# Patient Record
Sex: Female | Born: 1965 | Race: White | Hispanic: No | Marital: Married | Smoking: Current every day smoker
Health system: Southern US, Community
[De-identification: ages and names within clinical notes are randomized; demographics above are authoritative.]

## PROBLEM LIST (undated history)

## (undated) DIAGNOSIS — F988 Other specified behavioral and emotional disorders with onset usually occurring in childhood and adolescence: Secondary | ICD-10-CM

## (undated) DIAGNOSIS — F419 Anxiety disorder, unspecified: Secondary | ICD-10-CM

## (undated) HISTORY — PX: NASAL SINUS SURGERY: SHX719

## (undated) HISTORY — PX: TONSILLECTOMY: SUR1361

## (undated) HISTORY — PX: DILATION AND CURETTAGE OF UTERUS: SHX78

---

## 1998-02-24 ENCOUNTER — Other Ambulatory Visit: Admission: RE | Admit: 1998-02-24 | Discharge: 1998-02-24 | Payer: Self-pay | Admitting: Obstetrics and Gynecology

## 1998-10-22 ENCOUNTER — Ambulatory Visit (HOSPITAL_COMMUNITY): Admission: RE | Admit: 1998-10-22 | Discharge: 1998-10-22 | Payer: Self-pay | Admitting: Obstetrics and Gynecology

## 1999-02-17 ENCOUNTER — Ambulatory Visit (HOSPITAL_COMMUNITY): Admission: RE | Admit: 1999-02-17 | Discharge: 1999-02-17 | Payer: Self-pay | Admitting: Gastroenterology

## 1999-03-08 ENCOUNTER — Other Ambulatory Visit: Admission: RE | Admit: 1999-03-08 | Discharge: 1999-03-08 | Payer: Self-pay | Admitting: Obstetrics and Gynecology

## 1999-07-19 ENCOUNTER — Encounter (INDEPENDENT_AMBULATORY_CARE_PROVIDER_SITE_OTHER): Payer: Self-pay | Admitting: Specialist

## 1999-07-19 ENCOUNTER — Other Ambulatory Visit: Admission: RE | Admit: 1999-07-19 | Discharge: 1999-07-19 | Payer: Self-pay | Admitting: *Deleted

## 1999-08-12 ENCOUNTER — Emergency Department (HOSPITAL_COMMUNITY): Admission: EM | Admit: 1999-08-12 | Discharge: 1999-08-12 | Payer: Self-pay | Admitting: Emergency Medicine

## 2000-03-07 ENCOUNTER — Other Ambulatory Visit: Admission: RE | Admit: 2000-03-07 | Discharge: 2000-03-07 | Payer: Self-pay | Admitting: Obstetrics and Gynecology

## 2000-05-23 ENCOUNTER — Encounter: Payer: Self-pay | Admitting: Internal Medicine

## 2000-05-23 ENCOUNTER — Inpatient Hospital Stay (HOSPITAL_COMMUNITY): Admission: AD | Admit: 2000-05-23 | Discharge: 2000-05-25 | Payer: Self-pay | Admitting: Internal Medicine

## 2001-03-26 ENCOUNTER — Other Ambulatory Visit: Admission: RE | Admit: 2001-03-26 | Discharge: 2001-03-26 | Payer: Self-pay | Admitting: Obstetrics and Gynecology

## 2002-04-08 ENCOUNTER — Other Ambulatory Visit: Admission: RE | Admit: 2002-04-08 | Discharge: 2002-04-08 | Payer: Self-pay | Admitting: Obstetrics and Gynecology

## 2003-04-02 ENCOUNTER — Other Ambulatory Visit: Admission: RE | Admit: 2003-04-02 | Discharge: 2003-04-02 | Payer: Self-pay | Admitting: Obstetrics and Gynecology

## 2004-01-12 ENCOUNTER — Ambulatory Visit (HOSPITAL_COMMUNITY): Admission: RE | Admit: 2004-01-12 | Discharge: 2004-01-12 | Payer: Self-pay | Admitting: Gastroenterology

## 2004-06-10 ENCOUNTER — Other Ambulatory Visit: Admission: RE | Admit: 2004-06-10 | Discharge: 2004-06-10 | Payer: Self-pay | Admitting: Obstetrics and Gynecology

## 2004-12-04 ENCOUNTER — Emergency Department (HOSPITAL_COMMUNITY): Admission: EM | Admit: 2004-12-04 | Discharge: 2004-12-04 | Payer: Self-pay | Admitting: Emergency Medicine

## 2005-03-30 ENCOUNTER — Emergency Department (HOSPITAL_COMMUNITY): Admission: EM | Admit: 2005-03-30 | Discharge: 2005-03-30 | Payer: Self-pay | Admitting: Emergency Medicine

## 2005-06-23 ENCOUNTER — Other Ambulatory Visit: Admission: RE | Admit: 2005-06-23 | Discharge: 2005-06-23 | Payer: Self-pay | Admitting: Obstetrics and Gynecology

## 2007-06-18 ENCOUNTER — Ambulatory Visit (HOSPITAL_COMMUNITY): Admission: RE | Admit: 2007-06-18 | Discharge: 2007-06-18 | Payer: Self-pay | Admitting: Obstetrics and Gynecology

## 2007-06-18 ENCOUNTER — Encounter (INDEPENDENT_AMBULATORY_CARE_PROVIDER_SITE_OTHER): Payer: Self-pay | Admitting: Obstetrics and Gynecology

## 2008-04-02 ENCOUNTER — Emergency Department (HOSPITAL_COMMUNITY): Admission: EM | Admit: 2008-04-02 | Discharge: 2008-04-02 | Payer: Self-pay | Admitting: Emergency Medicine

## 2009-06-28 ENCOUNTER — Emergency Department (HOSPITAL_COMMUNITY): Admission: EM | Admit: 2009-06-28 | Discharge: 2009-06-28 | Payer: Self-pay | Admitting: Emergency Medicine

## 2010-10-10 ENCOUNTER — Encounter: Payer: Self-pay | Admitting: Obstetrics and Gynecology

## 2011-02-01 NOTE — Op Note (Signed)
Cindy Stone, Cindy Stone             ACCOUNT NO.:  [id number]   MEDICAL RECORD NO.:  [id number]          PATIENT TYPE:  AMB   LOCATION:  SDC                           FACILITY:  [facility]   PHYSICIAN:  Juluis Mire, M.D.   DATE OF BIRTH:  [birth year]   DATE OF PROCEDURE:  06/18/2007  DATE OF DISCHARGE:  06/17/2007                               OPERATIVE REPORT   PREOPERATIVE DIAGNOSIS:  Endometrial polyp.   POSTOPERATIVE DIAGNOSIS:  Endometrial polyp.   PROCEDURE:  Hysteroscopy, resection of polyp and endometrial curettings.   SURGEON:  Juluis Mire, M.D.   ANESTHESIA:  General.   ESTIMATED BLOOD LOSS:  Minimal.   PACKS AND DRAINS:  None.   INTRAOPERATIVE BLOOD REPLACED:  Placed none.   COMPLICATIONS:  None.   INDICATIONS:  Noted in history and physical.   PROCEDURE:  Patient was taken to OR, placed supine position.  After  satisfactory level of general anesthesia obtained, the patient was  placed in dorsal lithotomy position using Allen stirrups.  Perineum,  vagina cleansed out with antiseptic solution and draped in sterile  field, speculum was placed in the vaginal vault.  Cervix was grasped  with single tooth tenacula.  Uterus sounded to approximately 10 cm.  Cervix serially dilated to a size 35 Pratt dilator.  Operative  hysteroscope was introduced.  Intrauterine cavity was distended using  sorbitol.  Visualization revealed a large endometrial polyp on the  posterior wall.  This was resected and sent for pathological review.  Endometrial curettings were then obtained.  Total deficit was 100 mL.  There were no signs of complication, no active bleeding or signs of  perforation.  At this point in time, the patient taken out of dorsal  lithotomy position.  Once alert and extubated, transferred to recovery  in good condition.  Sponge, instrument and needle count reported as  correct by circulating nurse.      Juluis Mire, M.D.  Electronically Signed     JSM/MEDQ   D:  06/18/2007  T:  06/18/2007  Job:  [id number]

## 2011-02-01 NOTE — H&P (Signed)
NAME:  Cindy Stone, Cindy Stone NO.:  [id number]   MEDICAL RECORD NO.:  [id number]          PATIENT TYPE:  AMB   LOCATION:                                FACILITY:  [facility]   PHYSICIAN:  Juluis Mire, M.D.   DATE OF BIRTH:  November 04, 1965   DATE OF ADMISSION:  DATE OF DISCHARGE:                              HISTORY & PHYSICAL   The patient is a 45 year old gravida 3, para 3, married female, presents  for hysteroscopy.   In relation to the present admission, the patient had endometrial cells  noted on her Pap smear.  Subsequent saline infusion ultrasound revealed  large endometrial polyp for which she now presents for resection.   ALLERGIES:  No known drug allergies.   MEDICATIONS:  Paxil and Ambien.   PAST MEDICAL HISTORY:  Usual childhood diseases.  No significant  sequelae.  Does have a history of asthma as a child and associated  bronchitis.   PAST SURGICAL HISTORY:  She has had previous nose surgery.  She has also  had surgery on her bladder.   OBSTETRICAL HISTORY:  She has had 3 vaginal deliveries.   FAMILY HISTORY:  Noncontributory.   SOCIAL HISTORY:  No tobacco or alcohol use.   REVIEW OF SYSTEMS:  Noncontributory.   PHYSICAL EXAMINATION:  The patient is afebrile with stable vital signs.  HEENT:  The patient is normocephalic.  Pupils equally round, react to  light and accommodation.  Extraocular movements were intact.  Sclerae  and conjunctivae are clear.  Oropharynx clear.  NECK:  Without thyromegaly.  BREASTS:  No discrete masses.  LUNGS:  Clear.  CARDIOVASCULAR SYSTEM:  Regular rate.  No murmurs or gallops.  ABDOMINAL EXAM:  Benign.  PELVIC:  Normal external genitalia.  Vaginal mucosa is clear.  Cervix  unremarkable.  Uterus normal size, shape and contour.  Adnexa free of  masses or tenderness.  EXTREMITIES:  Trace edema.  NEUROLOGICAL EXAM:  Grossly within normal limits.   IMPRESSION:  Endometrial polyp.   PLAN:  The patient is to undergo  hysteroscopic evaluation with  resection.  The risks  have been explained.  The risks include the risk  of infection.  Risk of vascular injury that could lead to hemorrhage  requiring transfusion or  possible hysterectomy.  Risk of injury to adjacent organs including  bladder, bowel, ureters that could require further exploratory surgery.  Risk of deep venous thrombosis and pulmonary emboli.  The patient  expressed understanding of indications and risks.      Juluis Mire, M.D.  Electronically Signed     JSM/MEDQ  D:  06/18/2007  T:  06/18/2007  Job:  [id number]

## 2011-02-04 NOTE — Op Note (Signed)
NAME:  Cindy Stone, NAPLES                       ACCOUNT NO.:  [id number]   MEDICAL RECORD NO.:  [id number]                   PATIENT TYPE:  AMB   LOCATION:  ENDO                                 FACILITY:  [facility]   PHYSICIAN:  James L. Malon Kindle., M.D.          DATE OF BIRTH:  03-26-1966   DATE OF PROCEDURE:  01/12/2004  DATE OF DISCHARGE:                                 OPERATIVE REPORT   PROCEDURE:  Colonoscopy.   MEDICATIONS:  Fentanyl 100 mcg, Versed 9 mg IV.   SCOPE:  Pediatric colonoscope.   INDICATIONS:  Strong family history of colon cancer.  Father had colon  cancer as well as a great-grandparent, some grandparents, uncles and aunts.  Mother has had polyps.  Brothers have had polyps.   DESCRIPTION OF PROCEDURE:  The procedure had been explained to the patient  and consent obtained.   With the patient in the left lateral decubitus position, the Olympus scope  was inserted and advanced.  We were able to reach the cecum without  difficulty.  The ileocecal valve and appendiceal orifice were seen.  The  scope was withdrawn and the cecum, ascending colon, transverse colon,  splenic flexure, descending and sigmoid colon were seen well.  No polyps  were seen throughout.  No significant diverticular disease.  The scope was  withdrawn to the rectum.  The rectum was free of polyps.  The scope was  withdrawn. The patient tolerated the procedure well.   ASSESSMENT:  No evidence of further colon polyps in a young woman with a  strong family history of colon cancer.  V16.0.   PLAN:  Will recommend repeating colonoscopy in five years and yearly  hemoccults.                                               James L. Malon Kindle., M.D.    Waldron Session  D:  01/12/2004  T:  01/12/2004  Job:  [id number]

## 2011-02-04 NOTE — Discharge Summary (Signed)
. [facility]  Patient:    Cindy Stone, Cindy Stone                 MRN: [id number] Adm. Date:  [id number] Disc. Date: [id number] Attending:  Miguel Aschoff CC:         Richard A. Jacky Kindle, M.D. fax # is [phone]   Discharge Summary  PROBLEM LIST: 1. Left upper lobe pneumonia, presumed viral, with associated atypical    lymphocytes. 2. Normocytic anemia, as well as leukopenia, lymphocyte predominant    differential. 3. Tobacco use, greater than 18 pack years. 4. History of pneumococcal pneumonia in May 1993. 5. History of sinus surgery in October 2000. 6. History of childhood asthma. 7. Seasonal allergies, receiving weekly desensitization shots and Claritin    p.r.n. 8. Insomnia.  REASON FOR ADMISSION:  The patient is a 45 year old female who was admitted by Urgent Care on May 23, 2000, for a left-sided pneumonia.  She had approximately six days of shortness of breath and a nonproductive cough, fevers of 101.5 at home, associated with shaking chills.  Because of her symptoms and progressive shortness of breath she was seen in Urgent Care on three consecutive days, September 2, 3, and 4, 2001, at which time she was given a dose of Rocephin IM, as well as three days of oral Tequin.  Chest x-rays taken on September 2 and 3, 2001, revealed a progressive left-sided pneumonia.  Blood work prior to admission revealed a normal white cell count of 4.5, hematocrit of 36.2, platelets of 163, 36% lymphocytes, and 55% granulocytes.  HOSPITAL COURSE:  The patient was admitted to a regular medical floor.  Her antibiotics were changed to azithromycin and ceftriaxone which she received for one day.  Given evidence supporting a viral etiology, antibiotics were discontinued on the second day.  The patient gradually improved with respect to her dyspnea, cough, and general malaise.  She did continue to have low-grade fevers up to 100 the day prior to  discharge.  She also received other supportive therapies, such as albuterol inhaler, antitussive medication, anti-inflammatories, and Tylenol.  ISSUES: #1 - LEFT UPPER LOBE PNEUMONIA PRESUMED VIRAL:  Chest x-ray obtained on admission was consistent with a left upper lobe infiltrate.  As noted above, the patients symptoms improved gradually with supportive care and discontinued antibiotics.  Blood cultures were drawn on admission which were negative, however, this was after three days of antibiotics.  The patient will follow up with her primary care Edelin Fryer in several days time to review her symptoms as well as review additional blood work as necessary.  She would probably need a followup chest x-ray in one months time to give that a quit time for resolution of changes.  #2 - ANEMIA AND LEUKOPENIA:  The patient did have a low normal white count, actually dipping to 3.9 with 30% neutrophils, lymphocyte predominant picture with atypical lymphocytes toxic granules.  Her discharge white blood count was 4.5, consistent with her numbers prior to admission.  In addition, her hematocrit had a significant drop from the mid 30s prior to admission to 28 on discharge.  Due to her falling counts, hematology consult was requested.  They reviewed the smear and noted multiple polysegmented neutrophils, and recommend testing for folate and B12 deficiencies, as well as a ferratin level.  LDH in the hospital was normal at 230, and reticulocyte count is pending.  It is assumed that her anemia and relative leukopenia was related to an acute viral infection, and  the hematology felt her safe for discharge with close followup.  #3 - TOBACCO USE:  Given history of prior pneumococcal pneumonia and current viral pneumonia, as well as all other associated risks, the patient was strongly advised to stop smoking.  #4 - ANXIETY:  The patient was significantly concerned about her health with her current symptoms,  and received extensive counseling for reassurance and to answer questions.  DISCHARGE LABORATORY DATA:  On May 25, 2000, white count of 4.5, hematocrit of 28.2, platelets of 191, MCV of 90.9, neutrophils 30%, lymphocytes 45%, monocytes 9%, eosinophils 9%.  PROCEDURES: 1. Chest x-ray. 2. Hematology consult.  DISPOSITION:  The patient will be discharged to home.  DISCHARGE MEDICATIONS: 1. Tussionex 5 to 10 cc p.o. q.h.s. 2. Tylenol p.r.n. 3. Sonata 5 to 10 mg p.o. q.h.s. 4. Albuterol metered dose inhaler one or two puffs q.2h. p.r.n.  The patient was advised to drink 1.5 to 2 L of fluid each day.  She was endorsing a little bit of lightheadedness upon standing, but was not orthostatic.  She also was advised prior to discharge to avoid taking NSAIDS as these can be associated with acquired pancytopenia.  She will be assessed by her primary care doctor, Dr. Jacky Kindle on Monday, May 29, 2000, at which time it would be recommended to check another CBC with differential, as well as a ferratin, B12, and folate shot. DD:  05/25/00 TD:  05/26/00 Job: [id number]

## 2011-06-30 LAB — CBC
HCT: 35.2 — ABNORMAL LOW
Hemoglobin: 12.1
MCHC: 34.3
MCV: 98.9
Platelets: 230
RBC: 3.55 — ABNORMAL LOW
RDW: 12.3
WBC: 6.4

## 2011-06-30 LAB — HCG, SERUM, QUALITATIVE: Preg, Serum: NEGATIVE

## 2012-10-15 ENCOUNTER — Other Ambulatory Visit: Payer: Self-pay | Admitting: Obstetrics and Gynecology

## 2012-10-15 DIAGNOSIS — R928 Other abnormal and inconclusive findings on diagnostic imaging of breast: Secondary | ICD-10-CM

## 2012-10-17 ENCOUNTER — Other Ambulatory Visit: Payer: Self-pay

## 2012-10-18 ENCOUNTER — Ambulatory Visit
Admission: RE | Admit: 2012-10-18 | Discharge: 2012-10-18 | Disposition: A | Discharge status: Home / Self Care | Payer: No Typology Code available for payment source | Source: Ambulatory Visit | Attending: Obstetrics and Gynecology | Admitting: Obstetrics and Gynecology

## 2012-10-18 ENCOUNTER — Other Ambulatory Visit: Payer: Self-pay

## 2012-10-18 DIAGNOSIS — R928 Other abnormal and inconclusive findings on diagnostic imaging of breast: Secondary | ICD-10-CM

## 2012-10-26 ENCOUNTER — Other Ambulatory Visit: Payer: Self-pay

## 2013-12-26 ENCOUNTER — Emergency Department (HOSPITAL_COMMUNITY)
Admission: EM | Admit: 2013-12-26 | Discharge: 2013-12-26 | Disposition: A | Discharge status: Home / Self Care | Payer: No Typology Code available for payment source | Attending: Emergency Medicine | Admitting: Emergency Medicine

## 2013-12-26 ENCOUNTER — Encounter (HOSPITAL_COMMUNITY): Payer: Self-pay | Admitting: Emergency Medicine

## 2013-12-26 DIAGNOSIS — Z79899 Other long term (current) drug therapy: Secondary | ICD-10-CM | POA: Insufficient documentation

## 2013-12-26 DIAGNOSIS — Z3202 Encounter for pregnancy test, result negative: Secondary | ICD-10-CM | POA: Insufficient documentation

## 2013-12-26 DIAGNOSIS — R519 Headache, unspecified: Secondary | ICD-10-CM

## 2013-12-26 DIAGNOSIS — R110 Nausea: Secondary | ICD-10-CM | POA: Insufficient documentation

## 2013-12-26 DIAGNOSIS — R209 Unspecified disturbances of skin sensation: Secondary | ICD-10-CM | POA: Insufficient documentation

## 2013-12-26 DIAGNOSIS — F17200 Nicotine dependence, unspecified, uncomplicated: Secondary | ICD-10-CM | POA: Insufficient documentation

## 2013-12-26 DIAGNOSIS — R42 Dizziness and giddiness: Secondary | ICD-10-CM | POA: Insufficient documentation

## 2013-12-26 DIAGNOSIS — R51 Headache: Secondary | ICD-10-CM | POA: Insufficient documentation

## 2013-12-26 DIAGNOSIS — H53149 Visual discomfort, unspecified: Secondary | ICD-10-CM | POA: Insufficient documentation

## 2013-12-26 DIAGNOSIS — R55 Syncope and collapse: Secondary | ICD-10-CM | POA: Insufficient documentation

## 2013-12-26 LAB — URINALYSIS, ROUTINE W REFLEX MICROSCOPIC
BILIRUBIN URINE: NEGATIVE
Glucose, UA: NEGATIVE mg/dL
Ketones, ur: NEGATIVE mg/dL
Leukocytes, UA: NEGATIVE
Nitrite: NEGATIVE
Protein, ur: NEGATIVE mg/dL
SPECIFIC GRAVITY, URINE: 1.012 (ref 1.005–1.030)
Urobilinogen, UA: 0.2 mg/dL (ref 0.0–1.0)
pH: 6 (ref 5.0–8.0)

## 2013-12-26 LAB — CBC
HEMATOCRIT: 36.9 % (ref 36.0–46.0)
Hemoglobin: 12.7 g/dL (ref 12.0–15.0)
MCH: 32.7 pg (ref 26.0–34.0)
MCHC: 34.4 g/dL (ref 30.0–36.0)
MCV: 95.1 fL (ref 78.0–100.0)
Platelets: 252 10*3/uL (ref 150–400)
RBC: 3.88 MIL/uL (ref 3.87–5.11)
RDW: 11.9 % (ref 11.5–15.5)
WBC: 8.1 10*3/uL (ref 4.0–10.5)

## 2013-12-26 LAB — POC URINE PREG, ED: PREG TEST UR: NEGATIVE

## 2013-12-26 LAB — BASIC METABOLIC PANEL
BUN: 14 mg/dL (ref 6–23)
CHLORIDE: 103 meq/L (ref 96–112)
CO2: 21 mEq/L (ref 19–32)
CREATININE: 0.56 mg/dL (ref 0.50–1.10)
Calcium: 9.6 mg/dL (ref 8.4–10.5)
GFR calc Af Amer: >90 mL/min (ref >90)
GFR calc non Af Amer: >90 mL/min (ref >90)
Glucose, Bld: 102 mg/dL — ABNORMAL HIGH (ref 70–99)
Potassium: 4.2 mEq/L (ref 3.7–5.3)
Sodium: 139 mEq/L (ref 137–147)

## 2013-12-26 LAB — URINE MICROSCOPIC-ADD ON

## 2013-12-26 MED ORDER — SODIUM CHLORIDE 0.9 % IV BOLUS (SEPSIS)
1000.0000 mL | Freq: Once | INTRAVENOUS | Status: AC
  Administered 2013-12-26: 1000 mL via INTRAVENOUS

## 2013-12-26 MED ORDER — DIPHENHYDRAMINE HCL 50 MG/ML IJ SOLN
25.0000 mg | Freq: Once | INTRAMUSCULAR | Status: AC
  Administered 2013-12-26: 25 mg via INTRAVENOUS
  Filled 2013-12-26: qty 1

## 2013-12-26 MED ORDER — ONDANSETRON HCL 4 MG/2ML IJ SOLN
4.0000 mg | Freq: Once | INTRAMUSCULAR | Status: AC
  Administered 2013-12-26: 4 mg via INTRAVENOUS
  Filled 2013-12-26: qty 2

## 2013-12-26 MED ORDER — KETOROLAC TROMETHAMINE 30 MG/ML IJ SOLN
30.0000 mg | Freq: Once | INTRAMUSCULAR | Status: AC
  Administered 2013-12-26: 30 mg via INTRAVENOUS
  Filled 2013-12-26: qty 1

## 2013-12-26 MED ORDER — METOCLOPRAMIDE HCL 5 MG/ML IJ SOLN
10.0000 mg | Freq: Once | INTRAMUSCULAR | Status: AC
  Administered 2013-12-26: 10 mg via INTRAVENOUS
  Filled 2013-12-26: qty 2

## 2013-12-26 NOTE — Discharge Instructions (Signed)
Call for a follow up appointment with a Family or Primary Care Provider.  Return to the Emergency Department if Symptoms worsen.   Take medication as prescribed.  Eat a well balanced diet and drink plenty of fluids.

## 2013-12-26 NOTE — ED Notes (Signed)
Pt c/o tingling, near syncopal episode, and headache stating this afternoon.  Pain score 7/10.  Pt reports that tingling and feeling of near syncope has resolved.  Pt reports she was seen by her ob/gyn because she was worried about anemia, but her Hgb was 13.5.

## 2013-12-26 NOTE — ED Notes (Signed)
Patient sitting up A&O x4. Informed of care plan. Ordered meds given, see MAR. Patient able to move all extremities and states "pain is improving." Resting comfortably at this time.

## 2013-12-26 NOTE — ED Provider Notes (Signed)
CSN: [id number]     Arrival date & time 12/26/13  1437 History   First MD Initiated Contact with Patient 12/26/13 1516     Chief Complaint  Patient presents with  . Near Syncope  . Headache     (Consider location/radiation/quality/duration/timing/severity/associated sxs/prior Treatment) HPI Comments: Cindy Stone is a 48 y.o. female in good health until today when she experienced an episode of near syncope, while at work.  She reports sitting at her desk, and while talking developed lightheadedness and numbness and tingling in fingers, self resolved.  She reports this lasted a short period of time and self resolved.  She denies LOC and reports the episode was witnessed  The patient was taken to her OB/GYN for further evaluation and Hbg check, due to heavy menses.  She states while waiting for the provider she developed a headache.  She describes the discomfort as gradual onset of headache, vertex dull constant pain. Denies aggravating factors. With associated mild photophobia.  The patient reports headaches in the past, similar but without lightheadedness. She reports nausea without emesis, and relates it to not eating anything today.  The patient reports her OB/GYN instructed her to come to the ED for further evaluation after checking her HBG, 13.5.  Patient's last menstrual period was 12/21/2013.   Patient is a 48 y.o. female presenting with near-syncope and headaches.  Near Syncope Associated symptoms include headaches and nausea. Pertinent negatives include no abdominal pain, chest pain, chills, diaphoresis, fever, neck pain or vomiting.  Headache Associated symptoms: nausea, near-syncope and photophobia   Associated symptoms: no abdominal pain, no diarrhea, no dizziness, no fever, no neck pain, no neck stiffness and no vomiting     History reviewed. No pertinent past medical history. Past Surgical History  Procedure Laterality Date  . Tonsillectomy    . Nasal sinus surgery    .  Dilation and curettage of uterus     History reviewed. No pertinent family history. History  Substance Use Topics  . Smoking status: Current Every Day Smoker -- 0.50 packs/day    Types: Cigarettes  . Smokeless tobacco: Not on file  . Alcohol Use: Yes   OB History   Grav Para Term Preterm Abortions TAB SAB Ect Mult Living                 Review of Systems  Constitutional: Negative for fever, chills and diaphoresis.  Eyes: Positive for photophobia. Negative for visual disturbance.  Respiratory: Negative for shortness of breath.   Cardiovascular: Positive for near-syncope. Negative for chest pain, palpitations and leg swelling.  Gastrointestinal: Positive for nausea. Negative for vomiting, abdominal pain, diarrhea and constipation.  Musculoskeletal: Negative for gait problem, neck pain and neck stiffness.  Neurological: Positive for light-headedness and headaches. Negative for dizziness and syncope.      Allergies  Fish allergy and Shellfish allergy  Home Medications   Current Outpatient Rx  Name  Route  Sig  Dispense  Refill  . cetirizine (ZYRTEC) 10 MG tablet   Oral   Take 10 mg by mouth daily.         Marland Kitchen. PARoxetine (PAXIL) 10 MG tablet   Oral   Take 10 mg by mouth daily.         Marland Kitchen. zolpidem (AMBIEN) 10 MG tablet   Oral   Take 10 mg by mouth at bedtime.          BP 131/70  Pulse 71  Temp(Src) 99.4 F (37.4 C) (Oral)  Resp 21  SpO2 96%  LMP 12/21/2013 Physical Exam  Nursing note and vitals reviewed. Constitutional: She is oriented to person, place, and time. She appears well-developed and well-nourished. No distress.  HENT:  Head: Normocephalic and atraumatic.  Eyes: Conjunctivae and EOM are normal. Pupils are equal, round, and reactive to light. No scleral icterus.  Neck: Normal range of motion. Neck supple.  Cardiovascular: Normal rate and regular rhythm.   Pulmonary/Chest: Effort normal and breath sounds normal. No respiratory distress. She has no  wheezes. She has no rales.  Abdominal: Soft. There is no tenderness. There is no rebound and no guarding.  Musculoskeletal: Normal range of motion.  Neurological: She is alert and oriented to person, place, and time.  Speech is clear and goal oriented, follows commands Cranial nerves III - XII grossly intact, no facial droop Normal strength in upper and lower extremities bilaterally, strong and equal grip strength Sensation normal to light and sharp touch Moves all 4 extremities without ataxia, coordination intact Ambulates without assistance, normal gait.  Skin: Skin is warm and dry. She is not diaphoretic.  Psychiatric: She has a normal mood and affect. Her behavior is normal.    ED Course  Procedures (including critical care time) Labs Review Labs Reviewed  BASIC METABOLIC PANEL - Abnormal; Notable for the following:    Glucose, Bld 102 (*)    All other components within normal limits  URINALYSIS, ROUTINE W REFLEX MICROSCOPIC - Abnormal; Notable for the following:    APPearance CLOUDY (*)    Hgb urine dipstick LARGE (*)    All other components within normal limits  URINE MICROSCOPIC-ADD ON - Abnormal; Notable for the following:    Bacteria, UA FEW (*)    All other components within normal limits  CBC  POC URINE PREG, ED   Imaging Review No results found.   EKG Interpretation   Date/Time:  Thursday December 26 2013 15:53:02 EDT Ventricular Rate:  74 PR Interval:  136 QRS Duration: 84 QT Interval:  493 QTC Calculation: 547 R Axis:   53 Text Interpretation:  Sinus rhythm Borderline T abnormalities, anterior  leads Prolonged QT interval Confirmed by WARD,  DO, KRISTEN ([id number]) on  12/26/2013 4:07:24 PM      MDM   Final diagnoses:  Near syncope  Headache   Pt with a near syncopal event and headache.  No neuro deficits on exam. Doubt SAH at this time. Migraine cocktail, labs ordered. CBC and BMP Without concerning abnormalities. Negative urine pregnancy. UA shows  blood, secondary to menses. EKG shows borderline T wave abnormalities with prolonged QT interval.  Re-eval pt reports symptom improvement and requesting to be discharged home. Discussed lab results, imaging results, and treatment plan with the patient. Return precautions given. Reports understanding and no other concerns at this time.  Patient is stable for discharge at this time.  Meds given in ED:  Medications  sodium chloride 0.9 % bolus 1,000 mL (0 mLs Intravenous Stopped 12/26/13 1645)  ketorolac (TORADOL) 30 MG/ML injection 30 mg (30 mg Intravenous Given 12/26/13 1601)  metoCLOPramide (REGLAN) injection 10 mg (10 mg Intravenous Given 12/26/13 1602)  diphenhydrAMINE (BENADRYL) injection 25 mg (25 mg Intravenous Given 12/26/13 1602)  ondansetron (ZOFRAN) injection 4 mg (4 mg Intravenous Given 12/26/13 1601)    New Prescriptions   No medications on file        Clabe Seal, PA-C 12/27/13 1920

## 2013-12-28 NOTE — ED Provider Notes (Signed)
Medical screening examination/treatment/procedure(s) were performed by non-physician practitioner and as supervising physician I was immediately available for consultation/collaboration.   EKG Interpretation   Date/Time:  Thursday December 26 2013 15:53:02 EDT Ventricular Rate:  74 PR Interval:  136 QRS Duration: 84 QT Interval:  493 QTC Calculation: 547 R Axis:   53 Text Interpretation:  Sinus rhythm Borderline T abnormalities, anterior  leads Prolonged QT interval Confirmed by Johara Lodwick,  DO, Izaak Sahr [phone]([id number]) on  12/26/2013 4:07:24 PM        Layla MawKristen N Blayne Garlick, DO 12/28/13 [id number]

## 2014-07-26 ENCOUNTER — Encounter (HOSPITAL_COMMUNITY): Payer: Self-pay | Admitting: *Deleted

## 2014-07-26 ENCOUNTER — Emergency Department (HOSPITAL_COMMUNITY): Payer: No Typology Code available for payment source

## 2014-07-26 ENCOUNTER — Emergency Department (HOSPITAL_COMMUNITY)
Admission: EM | Admit: 2014-07-26 | Discharge: 2014-07-26 | Disposition: A | Discharge status: Home / Self Care | Payer: No Typology Code available for payment source | Attending: Emergency Medicine | Admitting: Emergency Medicine

## 2014-07-26 DIAGNOSIS — Z720 Tobacco use: Secondary | ICD-10-CM | POA: Insufficient documentation

## 2014-07-26 DIAGNOSIS — Z79899 Other long term (current) drug therapy: Secondary | ICD-10-CM | POA: Diagnosis not present

## 2014-07-26 DIAGNOSIS — N23 Unspecified renal colic: Secondary | ICD-10-CM | POA: Insufficient documentation

## 2014-07-26 DIAGNOSIS — Z3202 Encounter for pregnancy test, result negative: Secondary | ICD-10-CM | POA: Diagnosis not present

## 2014-07-26 DIAGNOSIS — R109 Unspecified abdominal pain: Secondary | ICD-10-CM | POA: Diagnosis present

## 2014-07-26 LAB — URINALYSIS, ROUTINE W REFLEX MICROSCOPIC
BILIRUBIN URINE: NEGATIVE
Glucose, UA: NEGATIVE mg/dL
KETONES UR: NEGATIVE mg/dL
Nitrite: NEGATIVE
PH: 5.5 (ref 5.0–8.0)
Protein, ur: NEGATIVE mg/dL
SPECIFIC GRAVITY, URINE: 1.012 (ref 1.005–1.030)
Urobilinogen, UA: 0.2 mg/dL (ref 0.0–1.0)

## 2014-07-26 LAB — CBC WITH DIFFERENTIAL/PLATELET
BASOS ABS: 0 10*3/uL (ref 0.0–0.1)
BASOS PCT: 0 % (ref 0–1)
Eosinophils Absolute: 0.3 10*3/uL (ref 0.0–0.7)
Eosinophils Relative: 4 % (ref 0–5)
HCT: 34.8 % — ABNORMAL LOW (ref 36.0–46.0)
HEMOGLOBIN: 11.7 g/dL — AB (ref 12.0–15.0)
Lymphocytes Relative: 44 % (ref 12–46)
Lymphs Abs: 3.5 10*3/uL (ref 0.7–4.0)
MCH: 33 pg (ref 26.0–34.0)
MCHC: 33.6 g/dL (ref 30.0–36.0)
MCV: 98 fL (ref 78.0–100.0)
MONOS PCT: 10 % (ref 3–12)
Monocytes Absolute: 0.8 10*3/uL (ref 0.1–1.0)
NEUTROS ABS: 3.3 10*3/uL (ref 1.7–7.7)
NEUTROS PCT: 42 % — AB (ref 43–77)
PLATELETS: 209 10*3/uL (ref 150–400)
RBC: 3.55 MIL/uL — ABNORMAL LOW (ref 3.87–5.11)
RDW: 12.1 % (ref 11.5–15.5)
WBC: 7.8 10*3/uL (ref 4.0–10.5)

## 2014-07-26 LAB — I-STAT CHEM 8, ED
BUN: 22 mg/dL (ref 6–23)
Calcium, Ion: 1.23 mmol/L (ref 1.12–1.23)
Chloride: 108 mEq/L (ref 96–112)
Creatinine, Ser: 0.7 mg/dL (ref 0.50–1.10)
Glucose, Bld: 97 mg/dL (ref 70–99)
HCT: 37 % (ref 36.0–46.0)
Hemoglobin: 12.6 g/dL (ref 12.0–15.0)
Potassium: 3.8 mEq/L (ref 3.7–5.3)
Sodium: 140 mEq/L (ref 137–147)
TCO2: 20 mmol/L (ref 0–100)

## 2014-07-26 LAB — COMPREHENSIVE METABOLIC PANEL
ALBUMIN: 3.9 g/dL (ref 3.5–5.2)
ALK PHOS: 39 U/L (ref 39–117)
ALT: 12 U/L (ref 0–35)
AST: 18 U/L (ref 0–37)
Anion gap: 14 (ref 5–15)
BUN: 24 mg/dL — ABNORMAL HIGH (ref 6–23)
CO2: 20 mEq/L (ref 19–32)
Calcium: 9 mg/dL (ref 8.4–10.5)
Chloride: 103 mEq/L (ref 96–112)
Creatinine, Ser: 0.72 mg/dL (ref 0.50–1.10)
GFR calc Af Amer: >90 mL/min (ref >90)
GFR calc non Af Amer: >90 mL/min (ref >90)
Glucose, Bld: 93 mg/dL (ref 70–99)
POTASSIUM: 4.3 meq/L (ref 3.7–5.3)
Sodium: 137 mEq/L (ref 137–147)
TOTAL PROTEIN: 6.9 g/dL (ref 6.0–8.3)
Total Bilirubin: <0.2 mg/dL — ABNORMAL LOW (ref 0.3–1.2)

## 2014-07-26 LAB — URINE MICROSCOPIC-ADD ON

## 2014-07-26 LAB — POC URINE PREG, ED: Preg Test, Ur: NEGATIVE

## 2014-07-26 MED ORDER — PERCOCET 5-325 MG PO TABS
1.0000 | ORAL_TABLET | Freq: Four times a day (QID) | ORAL | Status: AC | PRN
Start: 2014-07-26 — End: ?

## 2014-07-26 MED ORDER — KETOROLAC TROMETHAMINE 30 MG/ML IJ SOLN
30.0000 mg | Freq: Once | INTRAMUSCULAR | Status: AC
  Administered 2014-07-26: 30 mg via INTRAVENOUS
  Filled 2014-07-26: qty 1

## 2014-07-26 MED ORDER — HYDROMORPHONE HCL 1 MG/ML IJ SOLN
1.0000 mg | Freq: Once | INTRAMUSCULAR | Status: AC
  Administered 2014-07-26: 1 mg via INTRAVENOUS
  Filled 2014-07-26: qty 1

## 2014-07-26 NOTE — ED Notes (Signed)
Pt reports onset of left flank pain last night, denies injury to back. Denies any n/v or difficulty urinating. Pt unable to get comfortable.

## 2014-07-26 NOTE — Discharge Instructions (Signed)
Return here as needed. Follow up with your doctor. Increase your fluid intake. °

## 2014-07-26 NOTE — ED Provider Notes (Signed)
CSN: [id number]     Arrival date & time 07/26/14  1758 History   First MD Initiated Contact with Patient 07/26/14 1819     Chief Complaint  Patient presents with  . Flank Pain     (Consider location/radiation/quality/duration/timing/severity/associated sxs/prior Treatment) HPI Patient presents to the emergency department with left-sided flank pain that started late last night.  The patient states that the pain has gotten worse over the last 24 hours.  She states that she has not had any nausea, vomiting, headache, blurred vision, weakness, numbness, dizziness, shortness of breath, chest pain, back pain, dysuria, hematuria, or syncope.  The patient states that she took ibuprofen with some relief of her symptoms.  The patient states that nothing seems to make her condition worse.  Patient states that she has never had any kidney stones in the past History reviewed. No pertinent past medical history. Past Surgical History  Procedure Laterality Date  . Tonsillectomy    . Nasal sinus surgery    . Dilation and curettage of uterus     History reviewed. No pertinent family history. History  Substance Use Topics  . Smoking status: Current Every Day Smoker -- 0.50 packs/day    Types: Cigarettes  . Smokeless tobacco: Not on file  . Alcohol Use: Yes   OB History    No data available     Review of Systems All other systems negative except as documented in the HPI. All pertinent positives and negatives as reviewed in the HPI.   Allergies  Fish allergy and Shellfish allergy  Home Medications   Prior to Admission medications   Medication Sig Start Date End Date Taking? Authorizing Provider  cetirizine (ZYRTEC) 10 MG tablet Take 10 mg by mouth daily.   Yes Historical Provider, MD  ibuprofen (ADVIL,MOTRIN) 200 MG tablet Take 800 mg by mouth every 8 (eight) hours as needed (pain).   Yes Historical Provider, MD  PARoxetine (PAXIL) 10 MG tablet Take 10 mg by mouth daily.   Yes Historical  Provider, MD  zolpidem (AMBIEN) 10 MG tablet Take 10 mg by mouth at bedtime.   Yes Historical Provider, MD   BP 125/57 mmHg  Pulse 70  Temp(Src) 98.2 F (36.8 C) (Oral)  Resp 15  Ht 5\' 6"  (1.676 m)  Wt 158 lb (71.668 kg)  BMI 25.51 kg/m2  SpO2 100%  LMP 06/28/2014 Physical Exam  Constitutional: She is oriented to person, place, and time. She appears well-developed and well-nourished. No distress.  HENT:  Head: Normocephalic and atraumatic.  Mouth/Throat: Oropharynx is clear and moist.  Eyes: Pupils are equal, round, and reactive to light.  Neck: Normal range of motion. Neck supple.  Cardiovascular: Normal rate, regular rhythm and normal heart sounds.  Exam reveals no gallop and no friction rub.   No murmur heard. Pulmonary/Chest: Effort normal.  Abdominal: Soft. Bowel sounds are normal. She exhibits no distension. There is no tenderness. There is no guarding.  Musculoskeletal: Normal range of motion. She exhibits no edema.  Neurological: She is alert and oriented to person, place, and time. She exhibits normal muscle tone. Coordination normal.  Skin: Skin is warm and dry. No rash noted. No erythema.  Nursing note and vitals reviewed.   ED Course  Procedures (including critical care time) Labs Review Labs Reviewed  CBC WITH DIFFERENTIAL - Abnormal; Notable for the following:    RBC 3.55 (*)    Hemoglobin 11.7 (*)    HCT 34.8 (*)    Neutrophils Relative % 42 (*)  All other components within normal limits  COMPREHENSIVE METABOLIC PANEL - Abnormal; Notable for the following:    BUN 24 (*)    Total Bilirubin <0.2 (*)    All other components within normal limits  URINALYSIS, ROUTINE W REFLEX MICROSCOPIC - Abnormal; Notable for the following:    APPearance CLOUDY (*)    Hgb urine dipstick SMALL (*)    Leukocytes, UA TRACE (*)    All other components within normal limits  URINE MICROSCOPIC-ADD ON - Abnormal; Notable for the following:    Squamous Epithelial / LPF MANY (*)     Casts GRANULAR CAST (*)    All other components within normal limits  POC URINE PREG, ED  I-STAT CHEM 8, ED    Imaging Review Ct Renal Stone Study  07/26/2014   CLINICAL DATA:  Left flank pain which initiated last night.  EXAM: CT ABDOMEN AND PELVIS WITHOUT CONTRAST  TECHNIQUE: Multidetector CT imaging of the abdomen and pelvis was performed following the standard protocol without IV contrast.  COMPARISON:  None.  FINDINGS: Lower chest:  Lung bases are clear.  No pericardial fluid  Hepatobiliary: No focal hepatic lesion. No biliary duct dilatation. Gallbladder is normal. Common bile duct is normal.  Pancreas: Pancreas is normal. No duct dilatation. No pancreatic inflammation.  Spleen: Normal spleen  Adrenals/urinary tract: Adrenal glands are normal. No nephrolithiasis or ureterolithiasis. No bladder calculi.  Stomach/Bowel: The stomach, small bowel, appendix, and cecum normal. Colon and rectosigmoid colon are normal.  Vascular/Lymphatic: Abdominal aorta is normal caliber. No retroperitoneal or periportal lymphadenopathy. No pelvic lymphadenopathy.  Reproductive: IUD in expected location within the uterus. No adnexal abnormality.  Musculoskeletal: No aggressive osseous lesion.  Other: No ventral or inguinal hernia.  IMPRESSION: 1. No nephrolithiasis, ureterolithiasis, or obstructive uropathy. 2. Normal appendix. 3. IUD in expected location.   Electronically Signed   By: Genevive BiStewart  Edmunds M.D.   On: 07/26/2014 20:39     EKG Interpretation None      MDM   Final diagnoses:  Flank pain    I had a discussion with the patient about her laboratory testing results and imaging.  I advised the patient that this could have been a small stone that passed and now is having ureteral spasm and colic.  I advised the patient follow up with her primary care doctor, told to return here for any worsening in her symptoms.  The patient is feeling relief her symptoms following IV pain medications.  I advised her  that this could be an evolving process that has not yet declared itself and she will need to return here for further evaluation if her condition changes    Carlyle DollyChristopher W Kailena Lubas, PA-C 07/27/14 0059  Gerhard Munchobert Lockwood, MD 07/27/14 1534

## 2014-07-26 NOTE — ED Notes (Signed)
Pt to CT at this time.

## 2014-07-26 NOTE — ED Notes (Signed)
Pt hooked back up to the cardiac monitor with BP cuff on and pulse on

## 2014-07-28 LAB — URINE CULTURE
Colony Count: NO GROWTH
Culture: NO GROWTH

## 2014-11-13 ENCOUNTER — Other Ambulatory Visit: Payer: Self-pay | Admitting: Obstetrics and Gynecology

## 2014-11-14 LAB — CYTOLOGY - PAP

## 2015-03-16 IMAGING — CT CT RENAL STONE PROTOCOL
2 of 4 series · 12 of 46 positions shown, 14 images · non-contrast
Comparison: None.

CLINICAL DATA: Left flank pain which initiated last night.

EXAM:
CT ABDOMEN AND PELVIS WITHOUT CONTRAST
TECHNIQUE: Multidetector CT imaging of the abdomen and pelvis was performed
following the standard protocol without IV contrast.

[Series 201: stone study, idose (2) · axial · 0.72mm/px · z∈[-483,-138]mm · 9 of 83 slices shown, 11 images]
[im 7/83  soft-tissue]
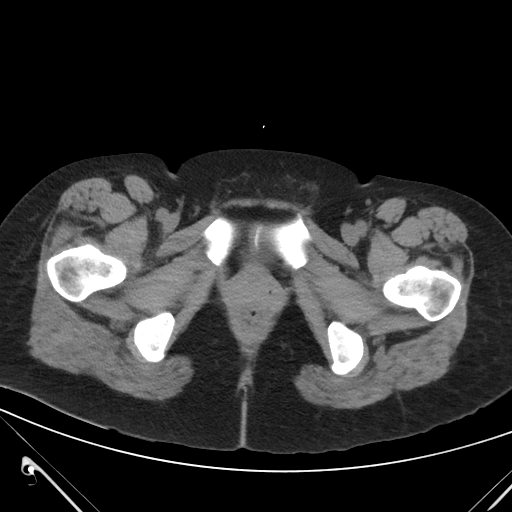
[im 7/83  bone]
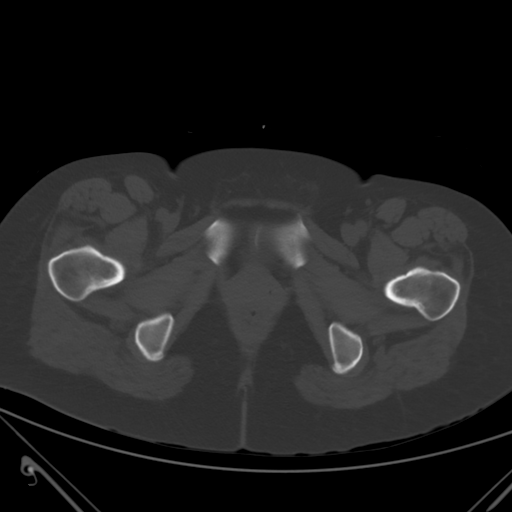
[im 14/83  soft-tissue]
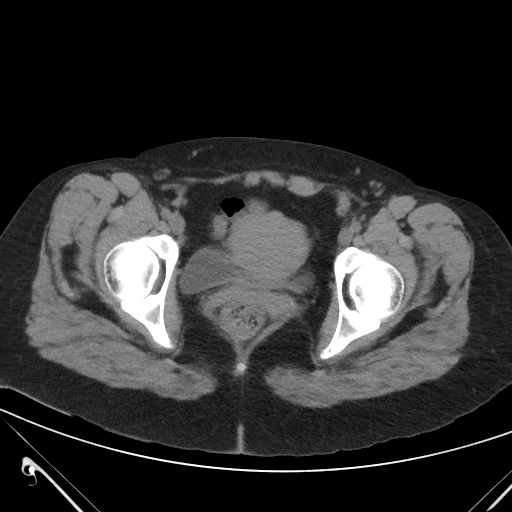
[im 24/83  soft-tissue]
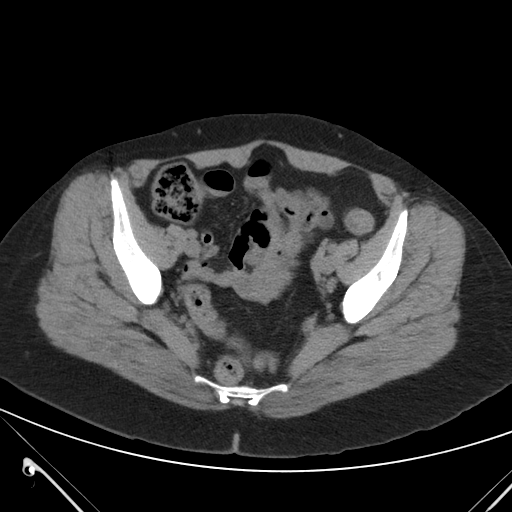
[im 31/83  soft-tissue]
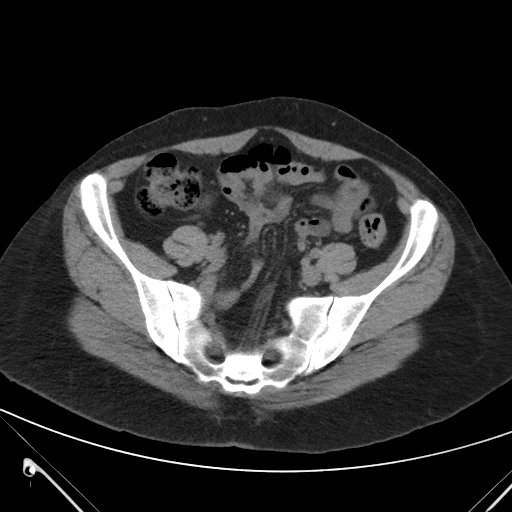
[im 42/83  soft-tissue]
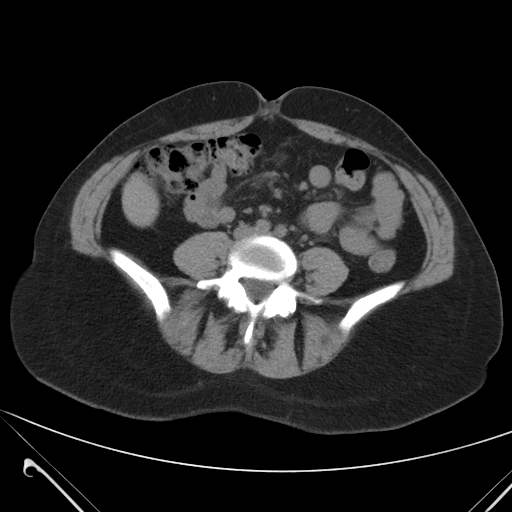
[im 52/83  soft-tissue]
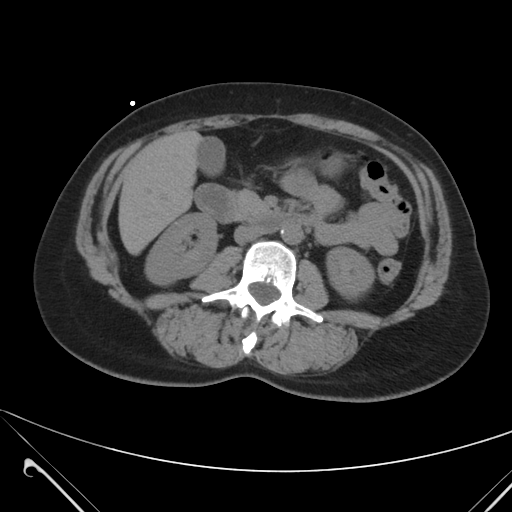
[im 59/83  soft-tissue]
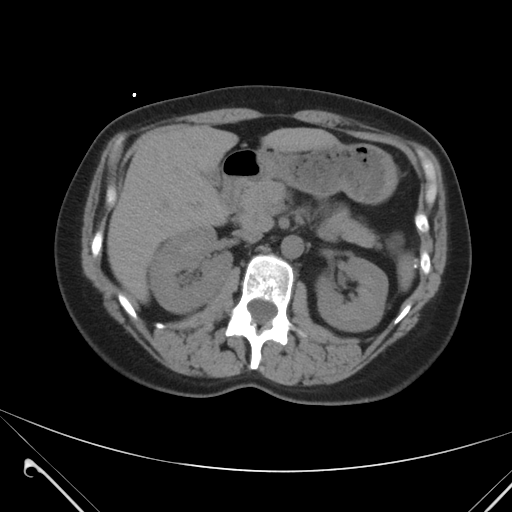
[im 69/83  soft-tissue]
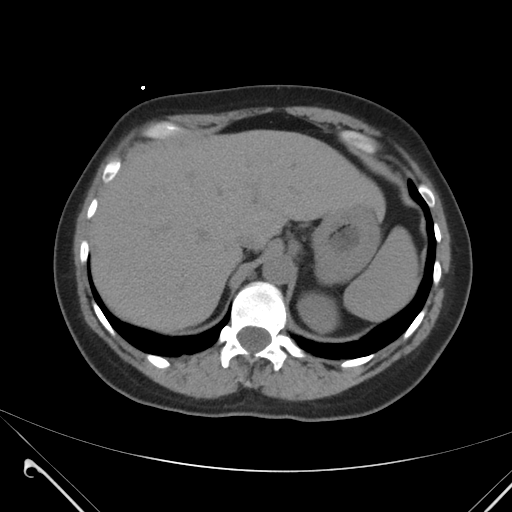
[im 76/83  soft-tissue]
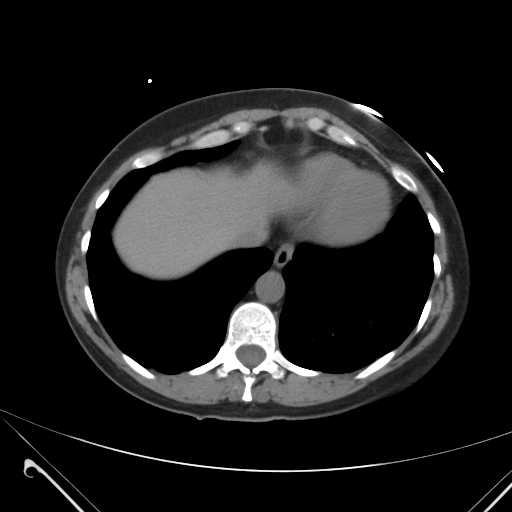
[im 76/83  bone]
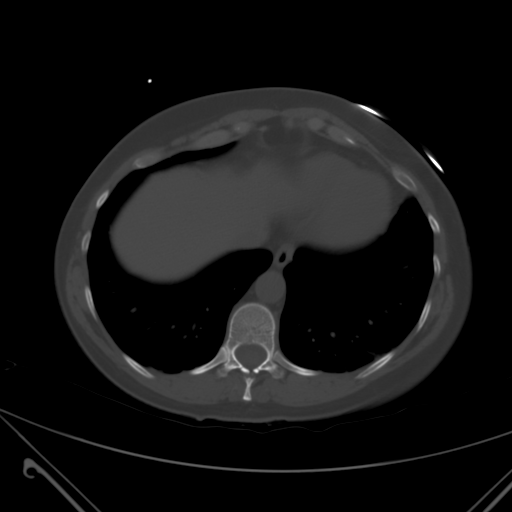

[Series 202: coronals, idose (2) · coronal · 0.45mm/px · 3 of 130 slices shown]
[im 44/130  soft-tissue]
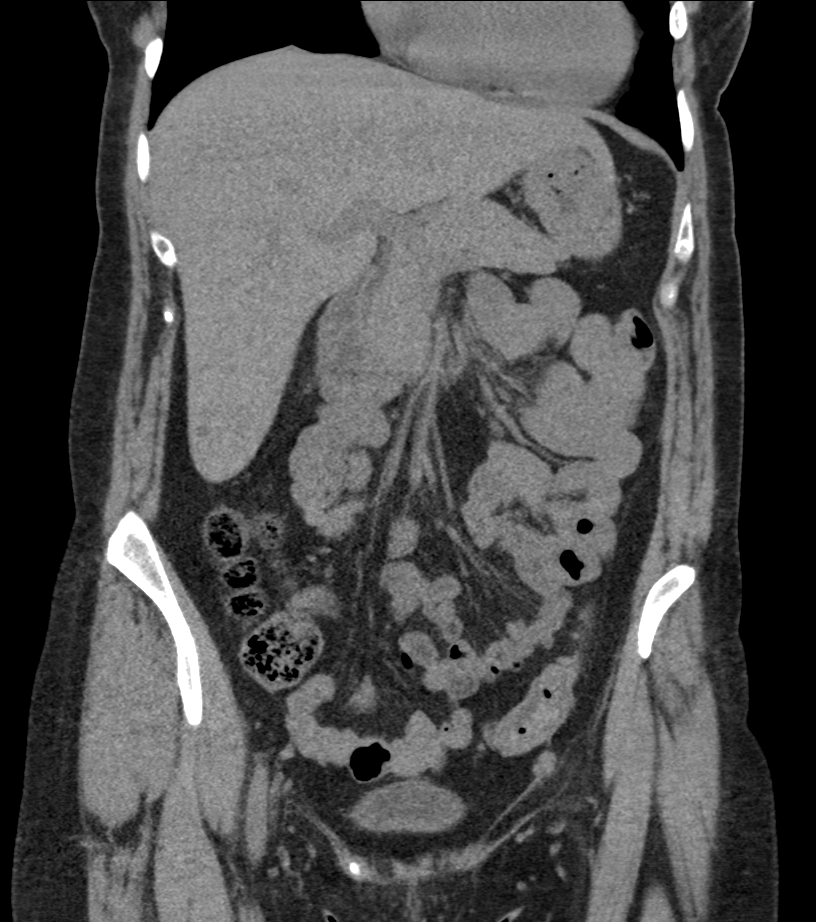
[im 58/130  soft-tissue]
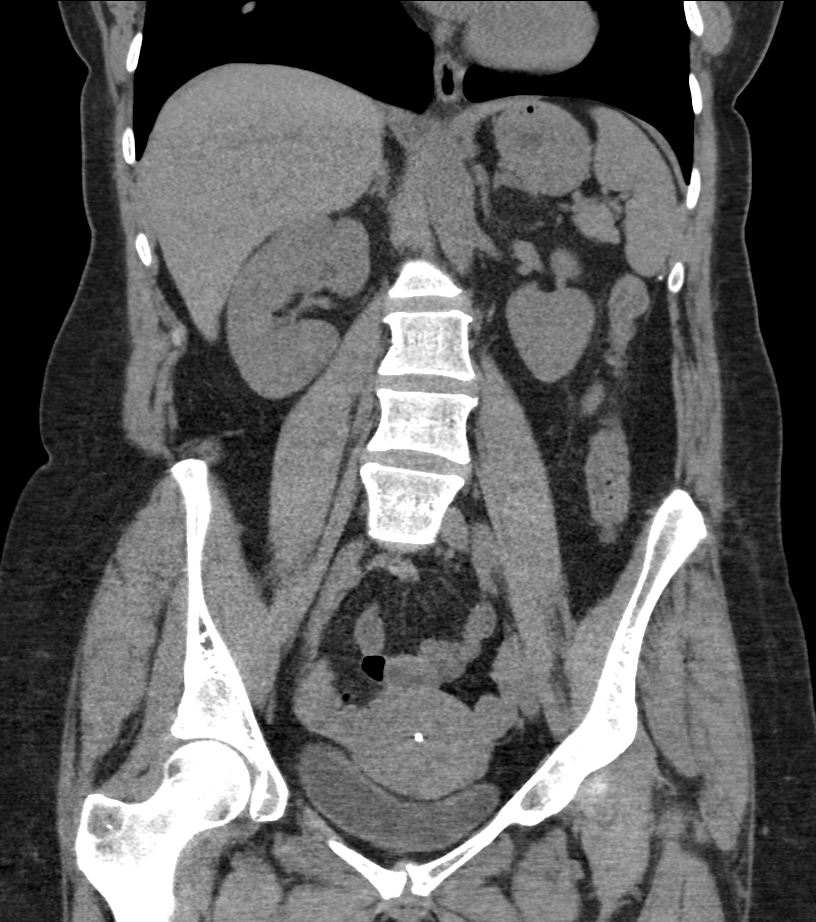
[im 72/130  soft-tissue]
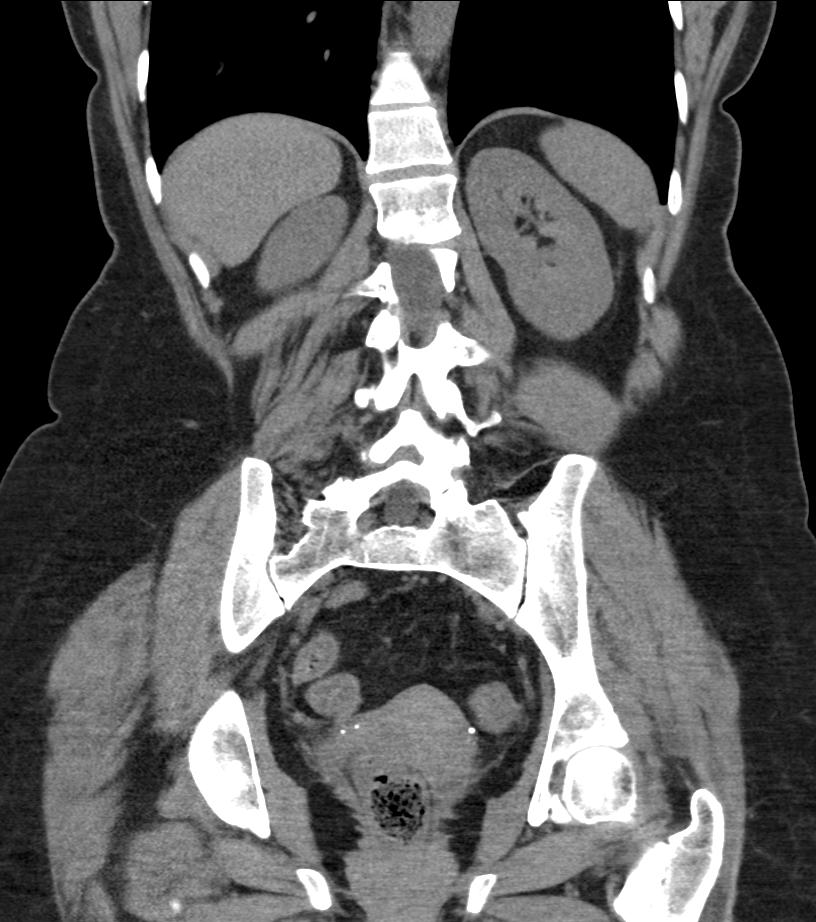

[12 of 46 positions shown; findings below may reference images not displayed]

FINDINGS: Lower chest:  Lung bases are clear.  No pericardial fluid

Hepatobiliary: No focal hepatic lesion. No biliary duct dilatation.
Gallbladder is normal. Common bile duct is normal.

Pancreas: Pancreas is normal. No duct dilatation. No pancreatic
inflammation.

Spleen: Normal spleen

Adrenals/urinary tract: Adrenal glands are normal. No
nephrolithiasis or ureterolithiasis. No bladder calculi.

Stomach/Bowel: The stomach, small bowel, appendix, and cecum normal.
Colon and rectosigmoid colon are normal.

Vascular/Lymphatic: Abdominal aorta is normal caliber. No
retroperitoneal or periportal lymphadenopathy. No pelvic
lymphadenopathy.

Reproductive: IUD in expected location within the uterus. No adnexal
abnormality.

Musculoskeletal: No aggressive osseous lesion.

Other: No ventral or inguinal hernia.
IMPRESSION: 1. No nephrolithiasis, ureterolithiasis, or obstructive uropathy.
2. Normal appendix.
3. IUD in expected location.

## 2015-12-11 ENCOUNTER — Other Ambulatory Visit: Payer: Self-pay | Admitting: Gastroenterology

## 2017-07-17 ENCOUNTER — Encounter (HOSPITAL_COMMUNITY): Payer: Self-pay | Admitting: *Deleted

## 2017-07-17 ENCOUNTER — Emergency Department (HOSPITAL_COMMUNITY)
Admission: EM | Admit: 2017-07-17 | Discharge: 2017-07-17 | Disposition: A | Discharge status: Home / Self Care | Payer: 59 | Attending: Emergency Medicine | Admitting: Emergency Medicine

## 2017-07-17 DIAGNOSIS — S01511A Laceration without foreign body of lip, initial encounter: Secondary | ICD-10-CM

## 2017-07-17 DIAGNOSIS — W01190A Fall on same level from slipping, tripping and stumbling with subsequent striking against furniture, initial encounter: Secondary | ICD-10-CM | POA: Insufficient documentation

## 2017-07-17 DIAGNOSIS — F17210 Nicotine dependence, cigarettes, uncomplicated: Secondary | ICD-10-CM | POA: Insufficient documentation

## 2017-07-17 DIAGNOSIS — Y999 Unspecified external cause status: Secondary | ICD-10-CM | POA: Diagnosis not present

## 2017-07-17 DIAGNOSIS — W19XXXA Unspecified fall, initial encounter: Secondary | ICD-10-CM

## 2017-07-17 DIAGNOSIS — Z79899 Other long term (current) drug therapy: Secondary | ICD-10-CM | POA: Diagnosis not present

## 2017-07-17 DIAGNOSIS — Y939 Activity, unspecified: Secondary | ICD-10-CM | POA: Diagnosis not present

## 2017-07-17 DIAGNOSIS — Y929 Unspecified place or not applicable: Secondary | ICD-10-CM | POA: Insufficient documentation

## 2017-07-17 HISTORY — DX: Anxiety disorder, unspecified: F41.9

## 2017-07-17 HISTORY — DX: Other specified behavioral and emotional disorders with onset usually occurring in childhood and adolescence: F98.8

## 2017-07-17 MED ORDER — LIDOCAINE-EPINEPHRINE (PF) 2 %-1:200000 IJ SOLN
20.0000 mL | Freq: Once | INTRAMUSCULAR | Status: AC
  Administered 2017-07-17: 20 mL via INTRADERMAL
  Filled 2017-07-17: qty 20

## 2017-07-17 NOTE — ED Provider Notes (Signed)
[facility]-EMERGENCY DEPT Provider Note   CSN: [id number] Arrival date & time: 07/17/17  7846     History   Chief Complaint Chief Complaint  Patient presents with  . Lip Laceration    HPI Cindy COWING is a 51 y.o. female who presents with lower lip swelling and laceration.  Patient states that she was getting ready for work this morning and she tripped over her dog and fell forward into a table.  The lower part of her face took the brunt of the impact.  She had a small amount of bleeding from her lip and from the left side of her nose which has spontaneously stopped.  She denies any loss of consciousness or neck pain.  She reports some pain over the tip of her nose but is mostly over the lower lip.  No dental pain, difficulty talking or swallowing.  HPI  Past Medical History:  Diagnosis Date  . ADD (attention deficit disorder)   . Anxiety     There are no active problems to display for this patient.   Past Surgical History:  Procedure Laterality Date  . DILATION AND CURETTAGE OF UTERUS    . NASAL SINUS SURGERY    . TONSILLECTOMY      OB History    No data available       Home Medications    Prior to Admission medications   Medication Sig Start Date End Date Taking? Authorizing Provider  cetirizine (ZYRTEC) 10 MG tablet Take 10 mg by mouth daily.    [provider]  ibuprofen (ADVIL,MOTRIN) 200 MG tablet Take 800 mg by mouth every 8 (eight) hours as needed (pain).    [provider]  PARoxetine (PAXIL) 10 MG tablet Take 10 mg by mouth daily.    [provider]  PERCOCET 5-325 MG per tablet Take 1 tablet by mouth every 6 (six) hours as needed for severe pain. 07/26/14   Lawyer, Cristal Deer, PA-C  zolpidem (AMBIEN) 10 MG tablet Take 10 mg by mouth at bedtime.    [provider]    Family History No family history on file.  Social History Social History  Substance Use Topics  . Smoking status:  Current Every Day Smoker    Packs/day: 0.50    Types: Cigarettes  . Smokeless tobacco: Never Used  . Alcohol use 4.2 oz/week    7 Glasses of wine per week     Allergies   Fish allergy and Shellfish allergy   Review of Systems Review of Systems  HENT: Positive for facial swelling (Lower lip) and nosebleeds (resolved). Negative for dental problem.   Musculoskeletal: Negative for neck pain.  Skin: Positive for wound.  Neurological: Negative for syncope and headaches.     Physical Exam Updated Vital Signs BP 129/80   Pulse 69   Temp 98.2 F (36.8 C) (Oral)   Resp 14   Ht 5\' 5"  (1.651 m)   Wt 62.6 kg (138 lb)   SpO2 100%   BMI 22.96 kg/m   Physical Exam  Constitutional: She is oriented to person, place, and time. She appears well-developed and well-nourished. No distress.  HENT:  Head: Normocephalic.  Moderate swelling over the right side of the lower lip.  3 cm deep laceration over the inside of the lower lip.  No loose teeth.  Full range of motion of TMJ.  Nasal septum is mildly tender.  Nasal bones are nontender.  There is no septal deformity or septal  hematoma.  Epistaxis has resolved.  Eyes: Pupils are equal, round, and reactive to light. Conjunctivae are normal. Right eye exhibits no discharge. Left eye exhibits no discharge. No scleral icterus.  Neck: Normal range of motion.  No midline tenderness  Cardiovascular: Normal rate.   Pulmonary/Chest: Effort normal. No respiratory distress.  Abdominal: She exhibits no distension.  Neurological: She is alert and oriented to person, place, and time.  Skin: Skin is warm and dry.  Psychiatric: She has a normal mood and affect. Her behavior is normal.  Nursing note and vitals reviewed.    ED Treatments / Results  Labs (all labs ordered are listed, but only abnormal results are displayed) Labs Reviewed - No data to display  EKG  EKG Interpretation None       Radiology No results  found.  Procedures .Marland Kitchen.Laceration Repair Date/Time: 07/17/2017 8:29 AM Performed by: Terance HartGEKAS, Jennamarie Goings MARIE Authorized by: Terance HartGEKAS, Vadie Principato MARIE   Consent:    Consent obtained:  Verbal   Consent given by:  Patient   Risks discussed:  Pain   Alternatives discussed:  No treatment Anesthesia (see MAR for exact dosages):    Anesthesia method:  Local infiltration   Local anesthetic:  Lidocaine 2% WITH epi Laceration details:    Location:  Face   Length (cm):  2   Depth (mm):  5 Repair type:    Repair type:  Simple Pre-procedure details:    Preparation:  Patient was prepped and draped in usual sterile fashion Exploration:    Hemostasis achieved with:  Epinephrine and direct pressure   Wound exploration: wound explored through full range of motion and entire depth of wound probed and visualized     Wound extent: no foreign bodies/material noted and no muscle damage noted     Contaminated: no   Treatment:    Area cleansed with:  Saline   Amount of cleaning:  Standard   Irrigation solution:  Tap water   Irrigation volume:  10cc   Irrigation method:  Tap   Visualized foreign bodies/material removed: no   Skin repair:    Repair method:  Sutures   Suture size:  5-0   Wound skin closure material used: Vicryl.   Suture technique:  Simple interrupted   Number of sutures:  3 Approximation:    Approximation:  Close   Vermilion border: well-aligned   Post-procedure details:    Dressing:  Open (no dressing)   Patient tolerance of procedure:  Tolerated well, no immediate complications   (including critical care time)   Medications Ordered in ED Medications  lidocaine-EPINEPHrine (XYLOCAINE W/EPI) 2 %-1:200000 (PF) injection 20 mL (20 mLs Intradermal Given 07/17/17 0820)     Initial Impression / Assessment and Plan / ED Course  I have reviewed the triage vital signs and the nursing notes.  Pertinent labs & imaging results that were available during my care of the patient were reviewed  by me and considered in my medical decision making (see chart for details).  51 year old female presents with facial trauma and lower lip laceration.  It was repaired and irrigated in the ED. Bottom of the wound visualized and bleeding controlled. 3 Vicryl sutures placed. Wound care discussed. Return precautions discussed.  Final Clinical Impressions(s) / ED Diagnoses   Final diagnoses:  Laceration of lower lip, initial encounter  Fall, initial encounter    New Prescriptions New Prescriptions   No medications on file     Bethel BornGekas, Hargun Spurling Marie, Cordelia Poche-C 07/17/17 [id number]  Charlynne Pander, MD 07/17/17 [phone]

## 2017-07-17 NOTE — ED Triage Notes (Signed)
Pt tripped over the dog this morning when leaving for work.  Presents with lac to lower lip.  Bleeding controlled.

## 2017-07-17 NOTE — ED Notes (Signed)
ED Provider at bedside. 

## 2017-07-17 NOTE — Discharge Instructions (Signed)
Ice area for 20 minutes at a time, several times a day Take Ibuprofen 600mg  every 6-8 hours as needed Sutures should dissolve in about 1 week Return for worsening symptoms

## 2017-11-13 DIAGNOSIS — Z0000 Encounter for general adult medical examination without abnormal findings: Secondary | ICD-10-CM | POA: Diagnosis not present

## 2017-11-20 DIAGNOSIS — F909 Attention-deficit hyperactivity disorder, unspecified type: Secondary | ICD-10-CM | POA: Diagnosis not present

## 2017-11-20 DIAGNOSIS — F17210 Nicotine dependence, cigarettes, uncomplicated: Secondary | ICD-10-CM | POA: Diagnosis not present

## 2017-11-20 DIAGNOSIS — Z0000 Encounter for general adult medical examination without abnormal findings: Secondary | ICD-10-CM | POA: Diagnosis not present

## 2017-11-20 DIAGNOSIS — F988 Other specified behavioral and emotional disorders with onset usually occurring in childhood and adolescence: Secondary | ICD-10-CM | POA: Diagnosis not present

## 2017-11-20 DIAGNOSIS — Z1389 Encounter for screening for other disorder: Secondary | ICD-10-CM | POA: Diagnosis not present

## 2018-02-05 DIAGNOSIS — Z1231 Encounter for screening mammogram for malignant neoplasm of breast: Secondary | ICD-10-CM | POA: Diagnosis not present

## 2018-02-05 DIAGNOSIS — Z01419 Encounter for gynecological examination (general) (routine) without abnormal findings: Secondary | ICD-10-CM | POA: Diagnosis not present

## 2018-02-05 DIAGNOSIS — Z6824 Body mass index (BMI) 24.0-24.9, adult: Secondary | ICD-10-CM | POA: Diagnosis not present

## 2018-06-06 DIAGNOSIS — J029 Acute pharyngitis, unspecified: Secondary | ICD-10-CM | POA: Diagnosis not present

## 2018-08-20 DIAGNOSIS — R87810 Cervical high risk human papillomavirus (HPV) DNA test positive: Secondary | ICD-10-CM | POA: Diagnosis not present

## 2018-11-12 DIAGNOSIS — J111 Influenza due to unidentified influenza virus with other respiratory manifestations: Secondary | ICD-10-CM | POA: Diagnosis not present

## 2018-11-21 DIAGNOSIS — Z0000 Encounter for general adult medical examination without abnormal findings: Secondary | ICD-10-CM | POA: Diagnosis not present

## 2018-11-26 DIAGNOSIS — F988 Other specified behavioral and emotional disorders with onset usually occurring in childhood and adolescence: Secondary | ICD-10-CM | POA: Diagnosis not present

## 2018-11-26 DIAGNOSIS — Z1331 Encounter for screening for depression: Secondary | ICD-10-CM | POA: Diagnosis not present

## 2018-11-26 DIAGNOSIS — F17210 Nicotine dependence, cigarettes, uncomplicated: Secondary | ICD-10-CM | POA: Diagnosis not present

## 2018-11-26 DIAGNOSIS — M6589 Other synovitis and tenosynovitis, multiple sites: Secondary | ICD-10-CM | POA: Diagnosis not present

## 2018-11-26 DIAGNOSIS — F909 Attention-deficit hyperactivity disorder, unspecified type: Secondary | ICD-10-CM | POA: Diagnosis not present

## 2018-11-26 DIAGNOSIS — Z0000 Encounter for general adult medical examination without abnormal findings: Secondary | ICD-10-CM | POA: Diagnosis not present

## 2019-04-16 DIAGNOSIS — H353111 Nonexudative age-related macular degeneration, right eye, early dry stage: Secondary | ICD-10-CM | POA: Diagnosis not present

## 2019-04-16 DIAGNOSIS — H353121 Nonexudative age-related macular degeneration, left eye, early dry stage: Secondary | ICD-10-CM | POA: Diagnosis not present

## 2019-04-17 DIAGNOSIS — L814 Other melanin hyperpigmentation: Secondary | ICD-10-CM | POA: Diagnosis not present

## 2019-04-17 DIAGNOSIS — L570 Actinic keratosis: Secondary | ICD-10-CM | POA: Diagnosis not present

## 2019-04-17 DIAGNOSIS — L821 Other seborrheic keratosis: Secondary | ICD-10-CM | POA: Diagnosis not present

## 2019-07-02 ENCOUNTER — Other Ambulatory Visit: Payer: Self-pay

## 2019-07-02 DIAGNOSIS — Z20822 Contact with and (suspected) exposure to covid-19: Secondary | ICD-10-CM

## 2019-07-04 LAB — NOVEL CORONAVIRUS, NAA: SARS-CoV-2, NAA: NOT DETECTED

## 2019-08-12 ENCOUNTER — Other Ambulatory Visit: Payer: Self-pay

## 2019-08-12 DIAGNOSIS — Z20822 Contact with and (suspected) exposure to covid-19: Secondary | ICD-10-CM

## 2019-08-14 LAB — NOVEL CORONAVIRUS, NAA: SARS-CoV-2, NAA: NOT DETECTED

## 2019-10-08 ENCOUNTER — Ambulatory Visit: Payer: No Typology Code available for payment source | Attending: Internal Medicine

## 2019-10-08 DIAGNOSIS — Z23 Encounter for immunization: Secondary | ICD-10-CM | POA: Insufficient documentation

## 2019-10-08 NOTE — Progress Notes (Signed)
   Covid-19 Vaccination Clinic  Name:  EMALY BOSCHERT    MRN: [id number] DOB: [birth year]  10/08/2019  Ms. Lorenzo was observed post Covid-19 immunization for 15 minutes without incidence. She was provided with Vaccine Information Sheet and instruction to access the V-Safe system.   Ms. Naeve was instructed to call 911 with any severe reactions post vaccine: Marland Kitchen Difficulty breathing  . Swelling of your face and throat  . A fast heartbeat  . A bad rash all over your body  . Dizziness and weakness    Immunizations Administered    Name Date Dose VIS Date Route   Pfizer COVID-19 Vaccine 10/08/2019  8:40 AM 0.3 mL 08/30/2019 Intramuscular   Manufacturer: [employer], [employer]   Lot: [id number]   NDC: [address]-2

## 2019-10-28 ENCOUNTER — Ambulatory Visit: Payer: No Typology Code available for payment source | Attending: Internal Medicine

## 2019-10-28 DIAGNOSIS — Z23 Encounter for immunization: Secondary | ICD-10-CM | POA: Insufficient documentation

## 2019-10-28 NOTE — Progress Notes (Signed)
   Covid-19 Vaccination Clinic  Name:  DELORES EDELSTEIN    MRN: [id number] DOB: 07-19-1966  10/28/2019  Ms. Zavaleta was observed post Covid-19 immunization for 15 minutes without incidence. She was provided with Vaccine Information Sheet and instruction to access the V-Safe system.   Ms. Humble was instructed to call 911 with any severe reactions post vaccine: Marland Kitchen Difficulty breathing  . Swelling of your face and throat  . A fast heartbeat  . A bad rash all over your body  . Dizziness and weakness    Immunizations Administered    Name Date Dose VIS Date Route   Pfizer COVID-19 Vaccine 10/28/2019  8:42 AM 0.3 mL 08/30/2019 Intramuscular   Manufacturer: [employer], [employer]   Lot: [id number]   NDC: [address]-6

## 2022-08-10 ENCOUNTER — Encounter (INDEPENDENT_AMBULATORY_CARE_PROVIDER_SITE_OTHER): Payer: Self-pay | Admitting: Ophthalmology

## 2022-08-10 ENCOUNTER — Encounter (INDEPENDENT_AMBULATORY_CARE_PROVIDER_SITE_OTHER): Payer: Self-pay

## 2022-08-10 DIAGNOSIS — H353132 Nonexudative age-related macular degeneration, bilateral, intermediate dry stage: Secondary | ICD-10-CM

## 2022-08-10 DIAGNOSIS — H43813 Vitreous degeneration, bilateral: Secondary | ICD-10-CM

## 2022-08-10 DIAGNOSIS — H35371 Puckering of macula, right eye: Secondary | ICD-10-CM

## 2022-08-10 DIAGNOSIS — H33303 Unspecified retinal break, bilateral: Secondary | ICD-10-CM

## 2022-08-17 ENCOUNTER — Encounter (INDEPENDENT_AMBULATORY_CARE_PROVIDER_SITE_OTHER): Payer: Self-pay | Admitting: Ophthalmology

## 2022-08-17 DIAGNOSIS — H33302 Unspecified retinal break, left eye: Secondary | ICD-10-CM

## 2022-08-30 ENCOUNTER — Encounter (INDEPENDENT_AMBULATORY_CARE_PROVIDER_SITE_OTHER): Payer: Self-pay | Admitting: Ophthalmology

## 2022-09-08 ENCOUNTER — Encounter (INDEPENDENT_AMBULATORY_CARE_PROVIDER_SITE_OTHER): Payer: Self-pay | Admitting: Ophthalmology

## 2022-09-08 DIAGNOSIS — H353132 Nonexudative age-related macular degeneration, bilateral, intermediate dry stage: Secondary | ICD-10-CM

## 2022-10-21 ENCOUNTER — Encounter (INDEPENDENT_AMBULATORY_CARE_PROVIDER_SITE_OTHER): Payer: No Typology Code available for payment source | Admitting: Ophthalmology

## 2022-11-11 ENCOUNTER — Encounter (INDEPENDENT_AMBULATORY_CARE_PROVIDER_SITE_OTHER): Payer: Commercial Managed Care - PPO | Admitting: Ophthalmology

## 2022-11-11 DIAGNOSIS — H353132 Nonexudative age-related macular degeneration, bilateral, intermediate dry stage: Secondary | ICD-10-CM

## 2022-11-11 DIAGNOSIS — H43813 Vitreous degeneration, bilateral: Secondary | ICD-10-CM | POA: Diagnosis not present

## 2022-11-11 DIAGNOSIS — H33303 Unspecified retinal break, bilateral: Secondary | ICD-10-CM | POA: Diagnosis not present
